# Patient Record
Sex: Male | Born: 1970 | Race: Asian | Hispanic: No | Marital: Married
Health system: Southern US, Community
[De-identification: ages and names within clinical notes are randomized; demographics above are authoritative.]

## PROBLEM LIST (undated history)

## (undated) DIAGNOSIS — I10 Essential (primary) hypertension: Secondary | ICD-10-CM

## (undated) DIAGNOSIS — E119 Type 2 diabetes mellitus without complications: Secondary | ICD-10-CM

---

## 2020-04-05 ENCOUNTER — Other Ambulatory Visit: Payer: Self-pay

## 2020-04-05 ENCOUNTER — Emergency Department (HOSPITAL_COMMUNITY)
Admission: EM | Admit: 2020-04-05 | Discharge: 2020-04-05 | Disposition: A | Discharge status: Home / Self Care | Payer: No Typology Code available for payment source | Attending: Emergency Medicine | Admitting: Emergency Medicine

## 2020-04-05 ENCOUNTER — Emergency Department (HOSPITAL_COMMUNITY): Payer: No Typology Code available for payment source

## 2020-04-05 ENCOUNTER — Encounter (HOSPITAL_COMMUNITY): Payer: Self-pay | Admitting: Emergency Medicine

## 2020-04-05 DIAGNOSIS — R1013 Epigastric pain: Secondary | ICD-10-CM | POA: Diagnosis not present

## 2020-04-05 DIAGNOSIS — I10 Essential (primary) hypertension: Secondary | ICD-10-CM | POA: Diagnosis not present

## 2020-04-05 DIAGNOSIS — E119 Type 2 diabetes mellitus without complications: Secondary | ICD-10-CM | POA: Diagnosis not present

## 2020-04-05 DIAGNOSIS — R109 Unspecified abdominal pain: Secondary | ICD-10-CM | POA: Diagnosis present

## 2020-04-05 HISTORY — DX: Type 2 diabetes mellitus without complications: E11.9

## 2020-04-05 HISTORY — DX: Essential (primary) hypertension: I10

## 2020-04-05 LAB — CBC
HCT: 49.8 % (ref 39.0–52.0)
Hemoglobin: 16.4 g/dL (ref 13.0–17.0)
MCH: 29.8 pg (ref 26.0–34.0)
MCHC: 32.9 g/dL (ref 30.0–36.0)
MCV: 90.5 fL (ref 80.0–100.0)
Platelets: 176 10*3/uL (ref 150–400)
RBC: 5.5 MIL/uL (ref 4.22–5.81)
RDW: 13.2 % (ref 11.5–15.5)
WBC: 9.1 10*3/uL (ref 4.0–10.5)
nRBC: 0 % (ref 0.0–0.2)

## 2020-04-05 LAB — COMPREHENSIVE METABOLIC PANEL
ALT: 31 U/L (ref 0–44)
AST: 43 U/L — ABNORMAL HIGH (ref 15–41)
Albumin: 4.6 g/dL (ref 3.5–5.0)
Alkaline Phosphatase: 54 U/L (ref 38–126)
Anion gap: 15 (ref 5–15)
BUN: 15 mg/dL (ref 6–20)
CO2: 22 mmol/L (ref 22–32)
Calcium: 9.7 mg/dL (ref 8.9–10.3)
Chloride: 102 mmol/L (ref 98–111)
Creatinine, Ser: 1.53 mg/dL — ABNORMAL HIGH (ref 0.61–1.24)
GFR calc Af Amer: >60 mL/min (ref >60)
GFR calc non Af Amer: 53 mL/min — ABNORMAL LOW (ref >60)
Glucose, Bld: 124 mg/dL — ABNORMAL HIGH (ref 70–99)
Potassium: 4.1 mmol/L (ref 3.5–5.1)
Sodium: 139 mmol/L (ref 135–145)
Total Bilirubin: 1.8 mg/dL — ABNORMAL HIGH (ref 0.3–1.2)
Total Protein: 7.2 g/dL (ref 6.5–8.1)

## 2020-04-05 LAB — URINALYSIS, ROUTINE W REFLEX MICROSCOPIC
Bacteria, UA: NONE SEEN
Bilirubin Urine: NEGATIVE
Glucose, UA: NEGATIVE mg/dL
Hgb urine dipstick: NEGATIVE
Ketones, ur: 20 mg/dL — AB
Leukocytes,Ua: NEGATIVE
Nitrite: NEGATIVE
Protein, ur: 100 mg/dL — AB
Specific Gravity, Urine: 1.014 (ref 1.005–1.030)
pH: 7 (ref 5.0–8.0)

## 2020-04-05 LAB — LIPASE, BLOOD: Lipase: 32 U/L (ref 11–51)

## 2020-04-05 MED ORDER — ONDANSETRON HCL 4 MG/2ML IJ SOLN
4.0000 mg | Freq: Once | INTRAMUSCULAR | Status: AC
  Administered 2020-04-05: 4 mg via INTRAVENOUS
  Filled 2020-04-05: qty 2

## 2020-04-05 MED ORDER — PANTOPRAZOLE SODIUM 40 MG IV SOLR
40.0000 mg | Freq: Once | INTRAVENOUS | Status: AC
  Administered 2020-04-05: 40 mg via INTRAVENOUS
  Filled 2020-04-05: qty 40

## 2020-04-05 MED ORDER — SODIUM CHLORIDE 0.9 % IV BOLUS
1000.0000 mL | Freq: Once | INTRAVENOUS | Status: AC
  Administered 2020-04-05: 1000 mL via INTRAVENOUS

## 2020-04-05 MED ORDER — HYDROMORPHONE HCL 1 MG/ML IJ SOLN
1.0000 mg | Freq: Once | INTRAMUSCULAR | Status: AC
  Administered 2020-04-05: 1 mg via INTRAVENOUS
  Filled 2020-04-05: qty 1

## 2020-04-05 MED ORDER — DROPERIDOL 2.5 MG/ML IJ SOLN
1.2500 mg | Freq: Once | INTRAMUSCULAR | Status: AC
  Administered 2020-04-05: 18:00:00 1.25 mg via INTRAVENOUS
  Filled 2020-04-05: qty 2

## 2020-04-05 MED ORDER — MORPHINE SULFATE (PF) 4 MG/ML IV SOLN
4.0000 mg | Freq: Once | INTRAVENOUS | Status: AC
  Administered 2020-04-05: 4 mg via INTRAVENOUS
  Filled 2020-04-05: qty 1

## 2020-04-05 MED ORDER — IOHEXOL 300 MG/ML  SOLN
100.0000 mL | Freq: Once | INTRAMUSCULAR | Status: AC | PRN
  Administered 2020-04-05: 100 mL via INTRAVENOUS

## 2020-04-05 MED ORDER — ALUM & MAG HYDROXIDE-SIMETH 200-200-20 MG/5ML PO SUSP
30.0000 mL | Freq: Once | ORAL | Status: AC
  Administered 2020-04-05: 30 mL via ORAL
  Filled 2020-04-05: qty 30

## 2020-04-05 MED ORDER — OXYCODONE-ACETAMINOPHEN 5-325 MG PO TABS: 1.0000 | ORAL_TABLET | ORAL | 0 refills | Status: AC | PRN

## 2020-04-05 MED ORDER — LIDOCAINE VISCOUS HCL 2 % MT SOLN
15.0000 mL | Freq: Once | OROMUCOSAL | Status: AC
  Administered 2020-04-05: 15 mL via ORAL
  Filled 2020-04-05: qty 15

## 2020-04-05 MED ORDER — HYOSCYAMINE SULFATE 0.125 MG PO TABS
0.1250 mg | ORAL_TABLET | Freq: Once | ORAL | Status: AC
  Administered 2020-04-05: 0.125 mg via ORAL
  Filled 2020-04-05: qty 1

## 2020-04-05 NOTE — ED Provider Notes (Signed)
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  Prior work-up at [facility] included CT scan and ultrasound that were negative.  Patient back with worsening pain.  CT abdomen pelvis with oral and IV contrast ordered to further evaluate.  If symptoms controlled and CT negative, likely can be discharged home.   Received signout from Dr. Madilyn Hook   Reviewed CT imaging, CT radiology report, updated patient, symptoms improving but still having some ongoing pain, will give additional round of pain/nausea medicine.  Recheck patient, pain has near completely resolved, tolerating p.o. without difficulty, states he has appointment with his primary doctor tomorrow back in [place] and wishes to be discharged.  Given work-up today, patient's current clinical condition, believe patient can be discharged and managed in the outpatient setting.  Already on PPI, already has Rx for Bentyl, will give short Rx for Percocet for pain control.  Reviewed return precautions, discharged home.   Darius Loll, MD 04/05/20 [phone]

## 2020-04-05 NOTE — ED Notes (Signed)
Pt in CT.

## 2020-04-05 NOTE — ED Triage Notes (Signed)
Pt arrives via EMS from home with mid abd pain for 3 days that worsened today. Pt was seen at [facility] few days ago and given meds for IBS. 4 mg zofran and 100 mcg fentanyl given by EMS. Pt still in great amount of pain and unable to sit still due to pain.

## 2020-04-05 NOTE — Discharge Instructions (Addendum)
Please follow-up with your primary doctor as discussed.  Return to ER if you have worsening abdominal pain, vomiting, fever.  Can take pain medicine as needed, note this can make you drowsy shot be taken while driving or operating heavy machinery.  Continue taking your omeprazole as previously prescribed.

## 2020-04-05 NOTE — ED Provider Notes (Signed)
Pt here for evaluation of severe epigastric abdominal pain that radiates to his left upper quadrant. Pain started two days ago after eating a cookout. He was seen in the [facility] emergency department and had a CT scan of his abdomen as well as ultrasound performed that day. He was feeling improved and was discharged home. He was relatively well for most the day yesterday. Today he developed severe recurrent abdominal pain. He has associated nausea. He has a history of prior gastric sleeve. No prior similar symptoms. On examination he is uncomfortable appearing but without reproducible abdominal tenderness.  Plan to check CT abd pelvis to r/o pancreatitis, obstruction.     Tilden Fossa, MD 04/05/20 [phone]

## 2020-04-05 NOTE — ED Provider Notes (Signed)
[facility] EMERGENCY DEPARTMENT Provider Note   CSN: [id number] Arrival date & time: 04/05/20  2330     History Chief Complaint  Patient presents with  . Abdominal Pain    Darius Conley is a 49 y.o. male.  HPI 49 yo male ho gastric sleeve, hypertension presents today complaining of abdominal pain.  Patient reports being seen at [facility] on Tuesday.  He states he had CT scan and ultrasound.  He says he was told that he had some gallstones but they did not seem to think that the pain was coming from that.  He reports he had an otherwise negative work-up.  He was discharged home.  He states that he "took it easy yesterday" and had a light diet.  He began having return of pain today.  He describes it as more to the left side but cramping and severe.  He is nauseated and has had dry heaves.  He denies fever, chills, urinary tract infection symptoms.  He denies having similar symptoms in the past.    Past Medical History:  Diagnosis Date  . 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 Hypertension     There are no problems to display for this patient.   The histories are not reviewed yet. Please review them in the "History" navigator section and refresh this [place].     No family history on file.  Social History   Tobacco Use  . Smoking status: Not on file  Substance Use Topics  . Alcohol use: Not on file  . Drug use: Not on file    Home Medications Prior to Admission medications   Not on File    Allergies    Patient has no allergy information on record.  Review of Systems   Review of Systems  All other systems reviewed and are negative.   Physical Exam Updated Vital Signs BP (!) 146/82   Pulse 70   Temp 99.1 F (37.3 C) (Oral)   Resp (!) 22   Ht 1.88 m (6\' 2" )   Wt 130.2 kg   SpO2 100%   BMI 36.85 kg/m   Physical Exam Vitals and nursing note reviewed.  Constitutional:      General: He  is in acute distress.     Appearance: He is well-developed.  HENT:     Head: Normocephalic.     Mouth/Throat:     Mouth: Mucous membranes are moist.  Eyes:     Extraocular Movements: Extraocular movements intact.  Cardiovascular:     Rate and Rhythm: Normal rate and regular rhythm.  Pulmonary:     Effort: Pulmonary effort is normal.     Breath sounds: Normal breath sounds.  Abdominal:     General: Abdomen is flat. Bowel sounds are normal. There is distension.     Palpations: Abdomen is soft.  Skin:    General: Skin is warm and dry.     Capillary Refill: Capillary refill takes less than 2 seconds.  Neurological:     General: No focal deficit present.     Mental Status: He is alert.  Psychiatric:        Mood and Affect: Mood normal.        Behavior: Behavior normal.     ED Results / Procedures / Treatments   Labs (all labs ordered are listed, but only abnormal results are displayed) Labs Reviewed  LIPASE, BLOOD  COMPREHENSIVE METABOLIC PANEL  CBC  URINALYSIS, ROUTINE W REFLEX MICROSCOPIC    EKG None  Radiology No results found.  Procedures Procedures (including critical care time)  Medications Ordered in ED Medications - No data to display  ED Course  I have reviewed the triage vital signs and the nursing notes.  Pertinent labs & imaging results that were available during my care of the patient were reviewed by me and considered in my medical decision making (see chart for details).    MDM Rules/Calculators/A&P                      Patient seen and assessed at triage and orders entered Patient care assumed by Dr. Madilyn Hook after moving to the back Final Clinical Impression(s) / ED Diagnoses Final diagnoses:  None    Rx / DC Orders ED Discharge Orders    None       Margarita Grizzle, MD 04/05/20 1201

## 2021-04-20 IMAGING — CT CT ABD-PELV W/ CM
2 of 5 series · 16 of 46 positions shown, 18 images · IV contrast (OMNI)
Comparison: 04/03/2020

CLINICAL DATA: Severe epigastric pain

EXAM:
CT ABDOMEN AND PELVIS WITH CONTRAST
TECHNIQUE: Multidetector CT imaging of the abdomen and pelvis was performed
using the standard protocol following bolus administration of
intravenous contrast.
CONTRAST:  100mL OMNIPAQUE IOHEXOL 300 MG/ML  SOLN

[Series 2: abd/ pelvis 5.0 i30f 2 · axial · 0.95mm/px · z∈[+698,+1138]mm · 13 of 100 slices shown, 15 images]
[im 6/100  soft-tissue]
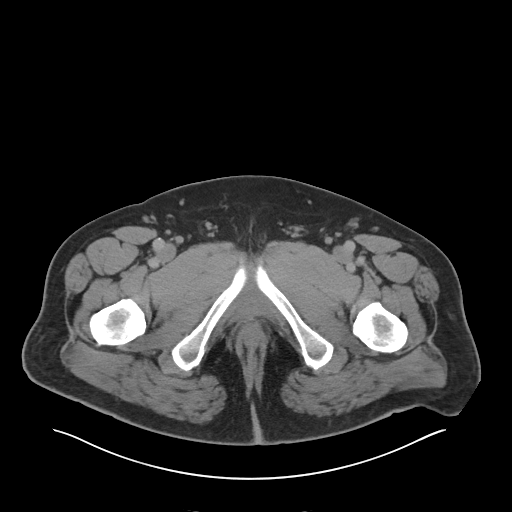
[im 6/100  bone]
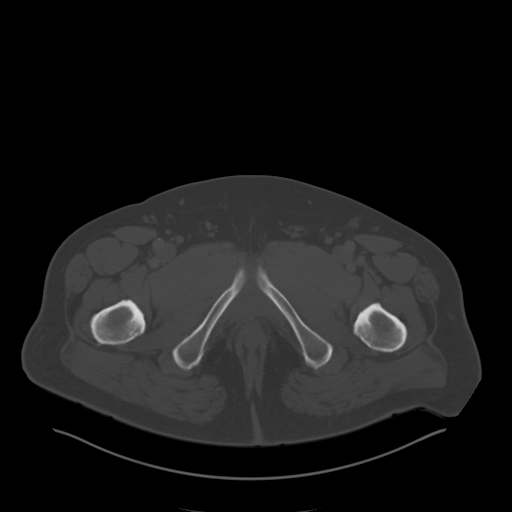
[im 12/100  soft-tissue]
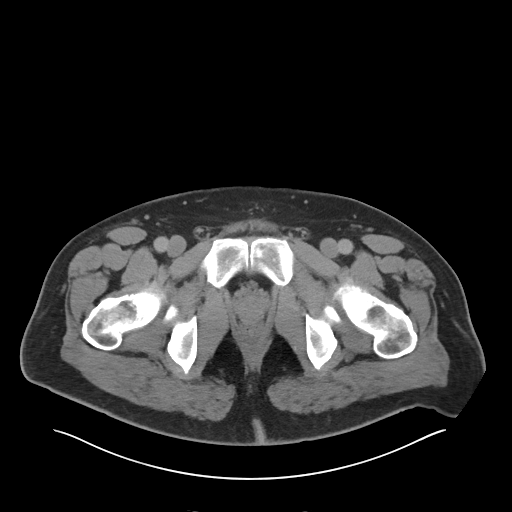
[im 23/100  soft-tissue]
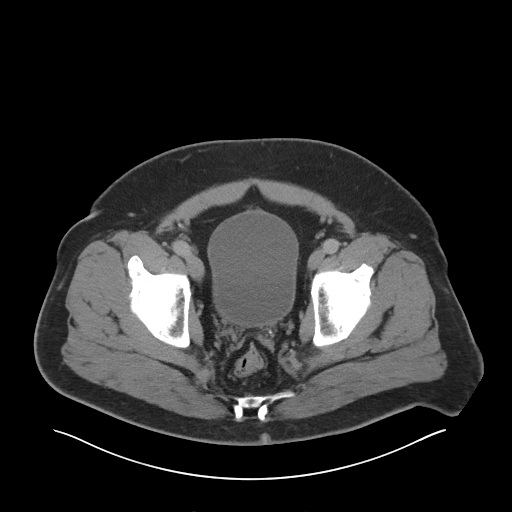
[im 28/100  soft-tissue]
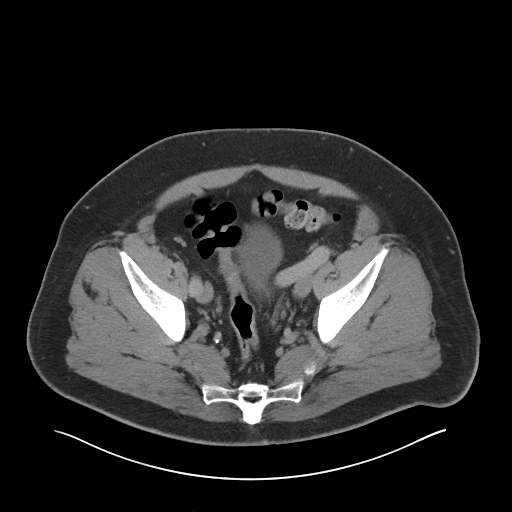
[im 34/100  soft-tissue]
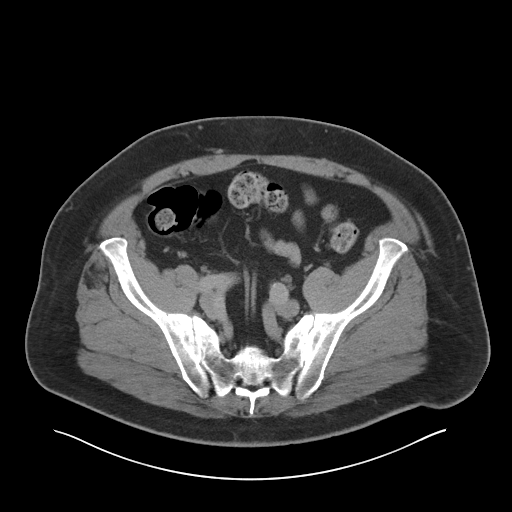
[im 45/100  soft-tissue]
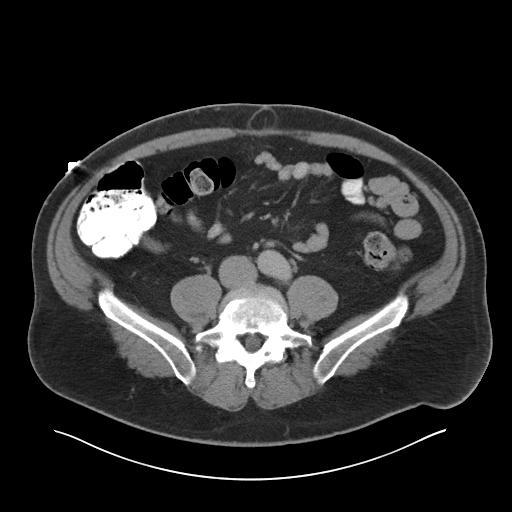
[im 50/100  soft-tissue]
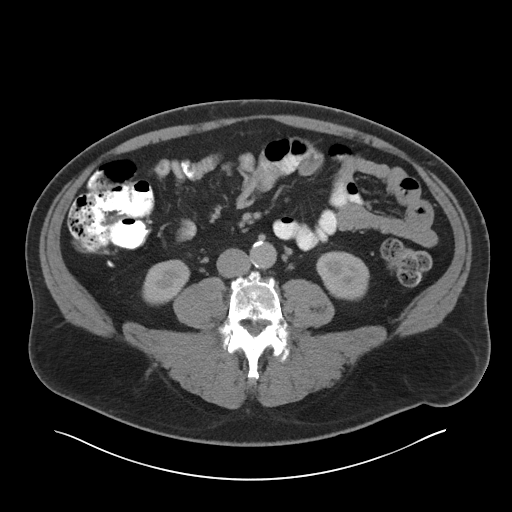
[im 56/100  soft-tissue]
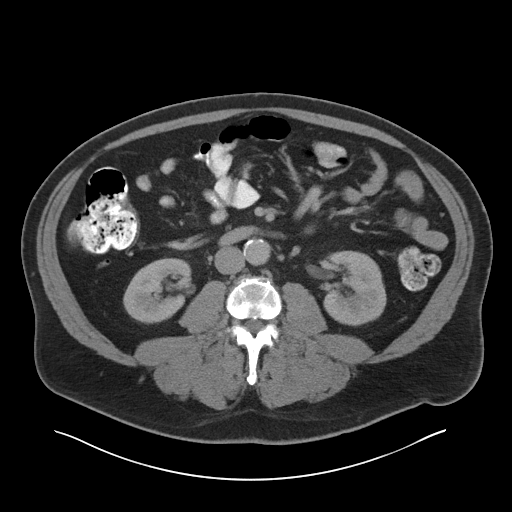
[im 67/100  soft-tissue]
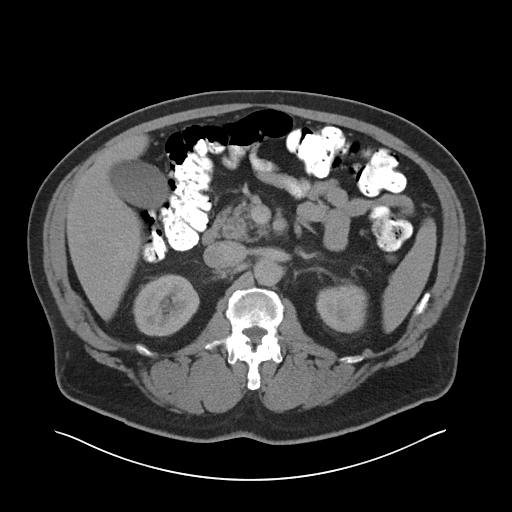
[im 67/100  bone]
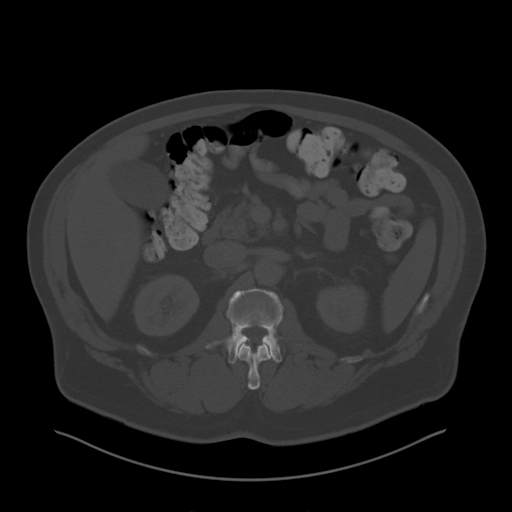
[im 72/100  soft-tissue]
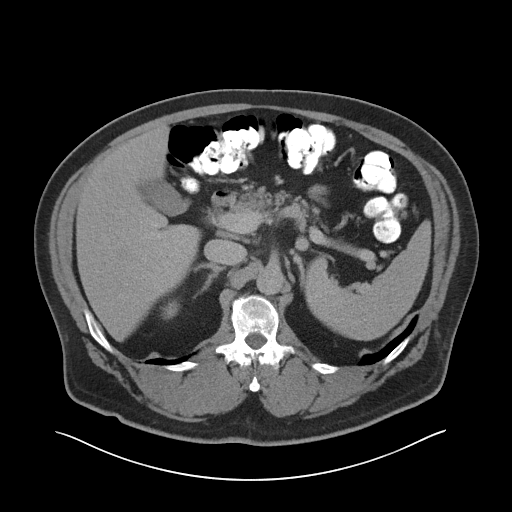
[im 78/100  soft-tissue]
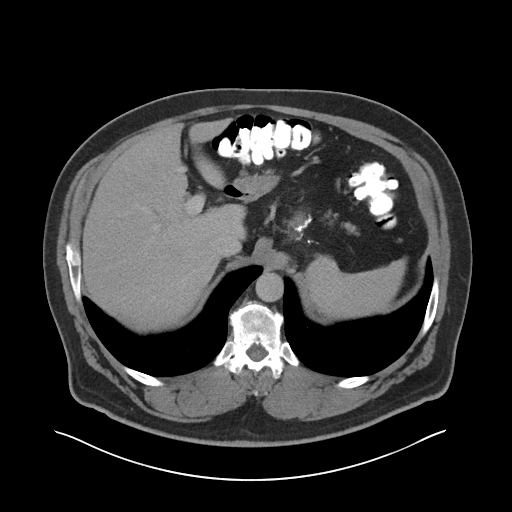
[im 89/100  soft-tissue]
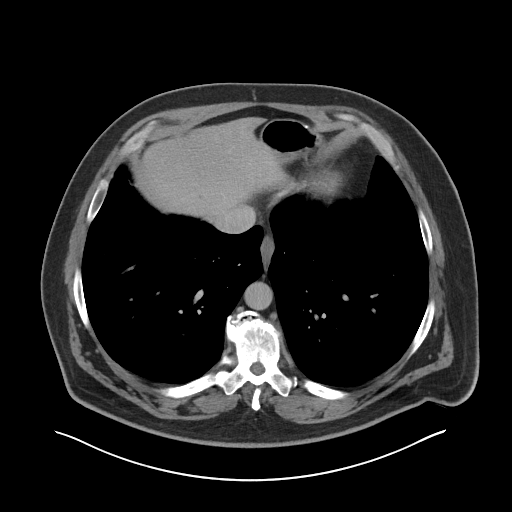
[im 94/100  soft-tissue]
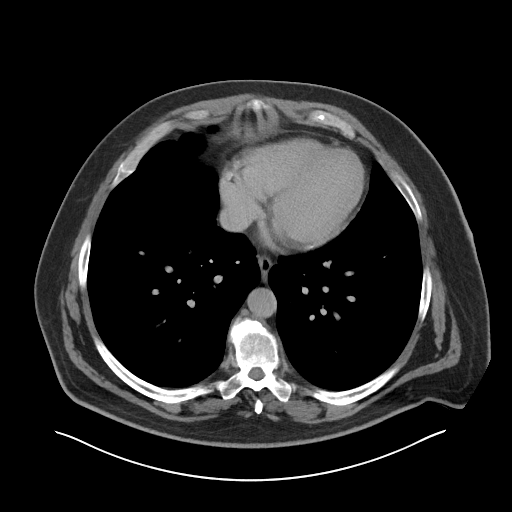

[Series 5: coronal soft tissue · coronal · 0.97mm/px · 3 of 128 slices shown]
[im 43/128  soft-tissue]
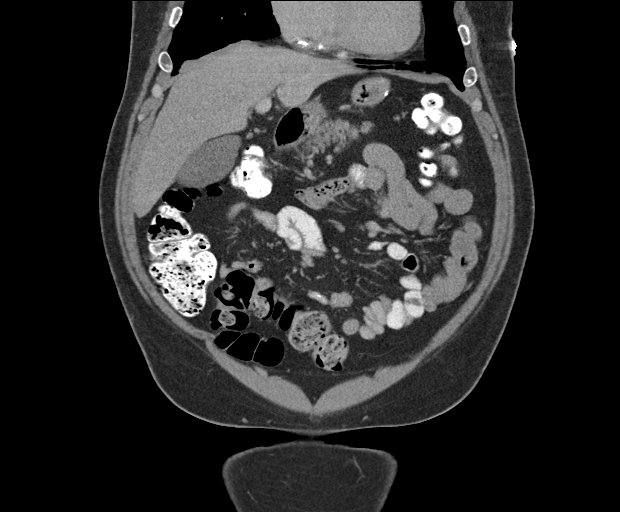
[im 57/128  soft-tissue]
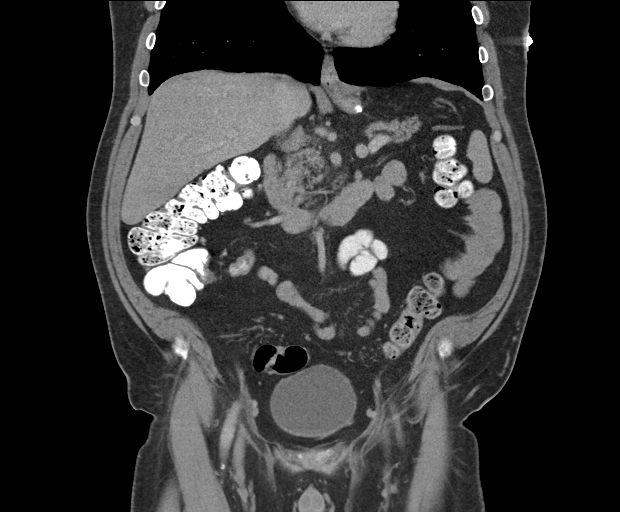
[im 71/128  soft-tissue]
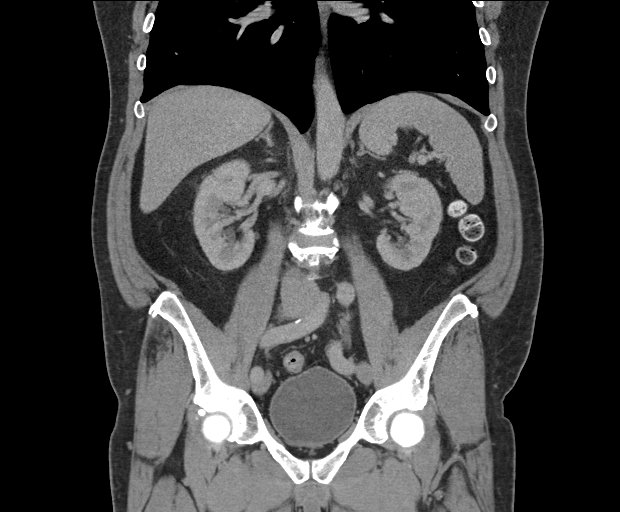

[16 of 46 positions shown; findings below may reference images not displayed]

FINDINGS: Lower chest: Scattered calcified granulomas. Lung bases are
otherwise clear. Coronary artery calcifications.

Hepatobiliary: No focal liver abnormality is seen. No gallstones,
gallbladder wall thickening, or biliary dilatation.

Pancreas: Unremarkable. No pancreatic ductal dilatation or
surrounding inflammatory changes.

Spleen: Normal in size without focal abnormality.

Adrenals/Urinary Tract: Adrenal glands are unremarkable. Kidneys are
normal, without renal calculi, focal lesion, or hydronephrosis.
Bladder is unremarkable.

Stomach/Bowel: Postsurgical changes from sleeve gastrectomy. No
dilated loops of bowel to suggest obstruction. No focal bowel wall
thickening. No pericolonic inflammatory changes. Contrast is present
within the distal small bowel and colon as well as the appendix. A
noninflamed appendix is present in the right lower quadrant (series
2, image 48).

Vascular/Lymphatic: Aortic atherosclerosis. Abdominal aorta measures
2.6 cm in diameter. No abdominopelvic lymphadenopathy.

Reproductive: Prostate is unremarkable.

Other: Fat containing umbilical hernia. No free air or free fluid
within the abdomen or pelvis.

Musculoskeletal: Multilevel degenerative changes of the
thoracolumbar spine. No new or acute osseous findings.
IMPRESSION: 1. No acute abdominopelvic findings.
2. Postsurgical changes from sleeve gastrectomy without complicating
features.
3. Fat containing umbilical hernia.
4. Aortic atherosclerosis. ([id number]).
